# Patient Record
Sex: Male | Born: 1990 | Race: White | Hispanic: No | Marital: Married | Smoking: Never smoker
Health system: Southern US, Community
[De-identification: ages and names within clinical notes are randomized; demographics above are authoritative.]

## PROBLEM LIST (undated history)

## (undated) DIAGNOSIS — Z8619 Personal history of other infectious and parasitic diseases: Secondary | ICD-10-CM

## (undated) DIAGNOSIS — T7840XA Allergy, unspecified, initial encounter: Secondary | ICD-10-CM

## (undated) HISTORY — DX: Allergy, unspecified, initial encounter: T78.40XA

## (undated) HISTORY — DX: Personal history of other infectious and parasitic diseases: Z86.19

## (undated) HISTORY — PX: TYMPANOSTOMY TUBE PLACEMENT: SHX32

## (undated) HISTORY — PX: VASECTOMY: SHX75

---
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
## 2012-11-17 ENCOUNTER — Ambulatory Visit: Payer: Managed Care, Other (non HMO) | Admitting: Family

## 2012-11-17 DIAGNOSIS — Z0289 Encounter for other administrative examinations: Secondary | ICD-10-CM

## 2016-12-20 NOTE — Progress Notes (Signed)
This encounter was created in error - please disregard.

## 2018-07-01 DIAGNOSIS — M5116 Intervertebral disc disorders with radiculopathy, lumbar region: Secondary | ICD-10-CM | POA: Insufficient documentation

## 2018-12-04 DIAGNOSIS — F329 Major depressive disorder, single episode, unspecified: Secondary | ICD-10-CM | POA: Insufficient documentation

## 2019-07-01 ENCOUNTER — Other Ambulatory Visit: Payer: Self-pay

## 2019-07-01 ENCOUNTER — Ambulatory Visit (HOSPITAL_COMMUNITY)
Admission: EM | Admit: 2019-07-01 | Discharge: 2019-07-01 | Disposition: A | Discharge status: Home / Self Care | Attending: Family Medicine | Admitting: Family Medicine

## 2019-07-01 ENCOUNTER — Encounter (HOSPITAL_COMMUNITY): Payer: Self-pay

## 2019-07-01 DIAGNOSIS — K529 Noninfective gastroenteritis and colitis, unspecified: Secondary | ICD-10-CM

## 2019-07-01 DIAGNOSIS — M62830 Muscle spasm of back: Secondary | ICD-10-CM

## 2019-07-01 MED ORDER — CYCLOBENZAPRINE HCL 10 MG PO TABS: ORAL_TABLET | ORAL | 0 refills | Status: DC

## 2019-07-01 MED ORDER — ONDANSETRON 4 MG PO TBDP: 4.0000 mg | ORAL_TABLET | Freq: Three times a day (TID) | ORAL | 0 refills | Status: DC | PRN

## 2019-07-01 NOTE — ED Notes (Signed)
Bed: UC10 Expected date:  Expected time:  Means of arrival:  Comments: 1:00 Appt

## 2019-07-01 NOTE — ED Triage Notes (Addendum)
Pt states his N/V/D started Sunday he started off burping & then the actual Vomiting started Tues. States before he vomits he burps and smells like eggs & he has had 15 BM's in one day. States he does NOT want COVID testing.

## 2019-07-01 NOTE — Discharge Instructions (Addendum)

## 2019-07-01 NOTE — ED Provider Notes (Signed)
[name]   [id number] 07/01/19 Arrival Time: 3491  ASSESSMENT & PLAN:  1. Gastroenteritis   2. Spasm of muscle of lower back     Declines COVID testing.   Meds ordered this encounter  Medications  . cyclobenzaprine (FLEXERIL) 10 MG tablet    Sig: Take 1 tablet by mouth 3 times daily as needed for muscle spasm. Warning: May cause drowsiness.    Dispense:  21 tablet    Refill:  0  . ondansetron (ZOFRAN-ODT) 4 MG disintegrating tablet    Sig: Take 1 tablet (4 mg total) by mouth every 8 (eight) hours as needed for nausea or vomiting.    Dispense:  15 tablet    Refill:  0    Discussed typical duration of symptoms for suspected viral GI illness. Will do his best to ensure adequate fluid intake in order to avoid dehydration. Will proceed to the Emergency Department for evaluation if unable to tolerate PO fluids regularly.  Otherwise he will f/u with his PCP or here if not showing improvement over the next 48-72 hours.  Follow-up Information    [facility].   Why: If back pain fails to improve as anticipated. Contact information: [address] [place] [phone]          Reviewed expectations re: course of current medical issues. Questions answered. Outlined signs and symptoms indicating need for more acute intervention. Patient verbalized understanding. After Visit Summary given.   SUBJECTIVE: History from: patient.  Jeffery Martin is a 29 y.o. male who presents with complaint of non-bilious, non-bloody intermittent n/v with non-bloody diarrhea. H/O frequent loose stools reported. Onset several days ago. Abdominal discomfort: mild and cramping if present. Symptoms are stable since beginning. Aggravating factors: eating. Alleviating factors: none. Associated symptoms: mild fatigue. He denies arthralgias, belching, chills, constipation and myalgias. Appetite: normal. PO intake: normal.  Ambulatory without assistance. Urinary symptoms: none. Sick contacts: none. Recent travel or camping: none. OTC treatment: none.  Also reports left mid to low back pain. H/O similar in [employer] Question spasm. Recent flare. Not limiting daily activities. No extremity sensation changes or weakness.   Past Surgical History:  Procedure Laterality Date  . TYMPANOSTOMY TUBE PLACEMENT  1996/97   bilateral     OBJECTIVE:  Vitals:   07/01/19 1328 07/01/19 1330  BP:  129/76  Pulse:  83  Resp:  18  Temp:  98.1 F (36.7 C)  TempSrc:  Oral  SpO2:  100%  Weight: 124.7 kg     General appearance: alert; no distress Lungs: speaks full sentences without difficulty; unlabored Heart: regular Abdomen: soft; no tenderness reported Back: mild lumbar paraspinal musculature TTP reported Extremities: no edema Skin: warm; dry Neurologic: normal gait Psychological: alert and cooperative; normal mood and affect    No Known Allergies                                             Past Medical History:  Diagnosis Date  . Allergy   . History of chicken pox    Social History   Socioeconomic History  . Marital status: Married    Spouse name: Not on file  . Number of children: Not on file  . Years of education: Not on file  . Highest education level: Not on file  Occupational History  .  Not on file  Tobacco Use  . Smoking status: Never Smoker  . Smokeless tobacco: Never Used  Substance and Sexual Activity  . Alcohol use: No  . Drug use: No  . Sexual activity: Yes    Birth control/protection: None  Other Topics Concern  . Not on file  Social History Narrative   [employer]- senior, plans to start working next year.   Lives with mom, step Dad- Kathrin Ruddy and bird.           Social Determinants of Health   Financial Resource Strain:   . Difficulty of Paying Living Expenses: Not on file  Food Insecurity:   . Worried About Programme researcher, broadcasting/film/video in the Last Year: Not on file    . Ran Out of Food in the Last Year: Not on file  Transportation Needs:   . Lack of Transportation (Medical): Not on file  . Lack of Transportation (Non-Medical): Not on file  Physical Activity:   . Days of Exercise per Week: Not on file  . Minutes of Exercise per Session: Not on file  Stress:   . Feeling of Stress : Not on file  Social Connections:   . Frequency of Communication with Friends and Family: Not on file  . Frequency of Social Gatherings with Friends and Family: Not on file  . Attends Religious Services: Not on file  . Active Member of Clubs or Organizations: Not on file  . Attends Banker Meetings: Not on file  . Marital Status: Not on file  Intimate Partner Violence:   . Fear of Current or Ex-Partner: Not on file  . Emotionally Abused: Not on file  . Physically Abused: Not on file  . Sexually Abused: Not on file   Family History  Problem Relation Age of Onset  . Hypertension Father   . Heart failure Father   . Stroke Other   . Cancer Paternal Grandmother        bone cancer  . Heart disease Neg Hx      Jeffery Layman, MD 07/01/19 [phone]

## 2020-06-27 ENCOUNTER — Other Ambulatory Visit: Payer: Self-pay

## 2020-06-27 ENCOUNTER — Emergency Department
Admission: EM | Admit: 2020-06-27 | Discharge: 2020-06-28 | Disposition: A | Discharge status: Home / Self Care | Attending: Emergency Medicine | Admitting: Emergency Medicine

## 2020-06-27 DIAGNOSIS — Z5321 Procedure and treatment not carried out due to patient leaving prior to being seen by health care provider: Secondary | ICD-10-CM | POA: Diagnosis not present

## 2020-06-27 DIAGNOSIS — H9201 Otalgia, right ear: Secondary | ICD-10-CM | POA: Insufficient documentation

## 2020-06-27 NOTE — ED Triage Notes (Signed)
Pt in with earache that started today, has had cold symptoms since Friday.

## 2020-06-30 ENCOUNTER — Ambulatory Visit: Payer: Self-pay

## 2020-06-30 ENCOUNTER — Ambulatory Visit
Admission: EM | Admit: 2020-06-30 | Discharge: 2020-06-30 | Disposition: A | Discharge status: Home / Self Care | Attending: Emergency Medicine | Admitting: Emergency Medicine

## 2020-06-30 ENCOUNTER — Other Ambulatory Visit: Payer: Self-pay

## 2020-06-30 DIAGNOSIS — H6691 Otitis media, unspecified, right ear: Secondary | ICD-10-CM

## 2020-06-30 DIAGNOSIS — Z1152 Encounter for screening for COVID-19: Secondary | ICD-10-CM | POA: Diagnosis not present

## 2020-06-30 MED ORDER — AMOXICILLIN-POT CLAVULANATE 875-125 MG PO TABS: 1.0000 | ORAL_TABLET | Freq: Two times a day (BID) | ORAL | 0 refills | Status: AC

## 2020-06-30 NOTE — ED Provider Notes (Signed)
[name]    CSN: [id number] Arrival date & time: 06/30/20  1209      History   Chief Complaint Chief Complaint  Patient presents with   Otalgia    Right ear x 1 4 days   Cough    X 1 week    HPI Jeffery Martin is a 30 y.o. male.   The history is provided by the patient. No language interpreter was used.  Otalgia Location:  Right Quality:  Aching Severity:  Severe Onset quality:  Gradual Duration:  1 week Timing:  Constant Progression:  Worsening Chronicity:  New Relieved by:  Nothing Worsened by:  Nothing Associated symptoms: cough   Cough Associated symptoms: ear pain   Pt reports he has been exposed to covid.  Pt reports he began having a cough several days ago   Past Medical History:  Diagnosis Date   Allergy    History of chicken pox     Patient Active Problem List   Diagnosis Date Noted   Sinusitis 07/08/2011   Conjunctivitis of left eye 07/08/2011   PROBLEMS WITH HEARING 03/19/2010    Past Surgical History:  Procedure Laterality Date   TYMPANOSTOMY TUBE PLACEMENT  1996/97   bilateral       Home Medications    Prior to Admission medications   Medication Sig Start Date End Date Taking? Authorizing Provider  amoxicillin-clavulanate (AUGMENTIN) 875-125 MG tablet Take 1 tablet by mouth every 12 (twelve) hours for 10 days. 06/30/20 07/10/20 Yes Cheron Schaumann K, PA-C  cyclobenzaprine (FLEXERIL) 10 MG tablet Take 1 tablet by mouth 3 times daily as needed for muscle spasm. Warning: May cause drowsiness. 07/01/19   Mardella Layman, MD  ondansetron (ZOFRAN-ODT) 4 MG disintegrating tablet Take 1 tablet (4 mg total) by mouth every 8 (eight) hours as needed for nausea or vomiting. 07/01/19   Mardella Layman, MD    Family History Family History  Problem Relation Age of Onset   Hypertension Father    Heart failure Father    Stroke Other    Cancer Paternal Grandmother        bone cancer   Heart disease Neg Hx     Social  History Social History   Tobacco Use   Smoking status: Never Smoker   Smokeless tobacco: Never Used  Building services engineer Use: Never used  Substance Use Topics   Alcohol use: No   Drug use: No     Allergies   Patient has no known allergies.   Review of Systems Review of Systems  HENT: Positive for ear pain.   Respiratory: Positive for cough.   All other systems reviewed and are negative.    Physical Exam Triage Vital Signs ED Triage Vitals  Enc Vitals Group     BP 06/30/20 1244 126/81     Pulse Rate 06/30/20 1244 84     Resp 06/30/20 1244 18     Temp 06/30/20 1244 98.3 F (36.8 C)     Temp Source 06/30/20 1244 Oral     SpO2 06/30/20 1244 97 %     Weight --      Height --      Head Circumference --      Peak Flow --      Pain Score 06/30/20 1239 4     Pain Loc --      Pain Edu? --      Excl. in GC? --    No data found.  Updated Vital Signs BP 126/81 (BP Location: Left Arm)    Pulse 84    Temp 98.3 F (36.8 C) (Oral)    Resp 18    SpO2 97%   Visual Acuity Right Eye Distance:   Left Eye Distance:   Bilateral Distance:    Right Eye Near:   Left Eye Near:    Bilateral Near:     Physical Exam Vitals and nursing note reviewed.  Constitutional:      Appearance: He is well-developed and well-nourished.  HENT:     Head: Normocephalic and atraumatic.     Ears:     Comments: Right tm erythematous, bulging,  Landmarks obscured.     Mouth/Throat:     Mouth: Mucous membranes are moist.  Eyes:     Conjunctiva/sclera: Conjunctivae normal.  Cardiovascular:     Rate and Rhythm: Normal rate and regular rhythm.     Heart sounds: No murmur heard.   Pulmonary:     Effort: Pulmonary effort is normal. No respiratory distress.     Breath sounds: Normal breath sounds.  Abdominal:     Palpations: Abdomen is soft.     Tenderness: There is no abdominal tenderness.  Musculoskeletal:        General: No edema. Normal range of motion.     Cervical back: Neck  supple.  Skin:    General: Skin is warm and dry.  Neurological:     Mental Status: He is alert.  Psychiatric:        Mood and Affect: Mood and affect normal.      UC Treatments / Results  Labs (all labs ordered are listed, but only abnormal results are displayed) Labs Reviewed  NOVEL CORONAVIRUS, NAA    EKG   Radiology No results found.  Procedures Procedures (including critical care time)  Medications Ordered in UC Medications - No data to display  Initial Impression / Assessment and Plan / UC Course  I have reviewed the triage vital signs and the nursing notes.  Pertinent labs & imaging results that were available during my care of the patient were reviewed by me and considered in my medical decision making (see chart for details).     MDM:  Covid pending,  Pt has otitis media, Pt given rx for augmentin.  Final Clinical Impressions(s) / UC Diagnoses   Final diagnoses:  Encounter for screening for COVID-19  Right otitis media, unspecified otitis media type     Discharge Instructions     Your covid is pending    ED Prescriptions    Medication Sig Dispense Auth. Provider   amoxicillin-clavulanate (AUGMENTIN) 875-125 MG tablet Take 1 tablet by mouth every 12 (twelve) hours for 10 days. 20 tablet Elson Areas, [place]     PDMP not reviewed this encounter.  An After Visit Summary was printed and given to the patient.    Elson Areas, [place] 06/30/20 1314

## 2020-06-30 NOTE — Discharge Instructions (Signed)
Your covid is pending °

## 2020-06-30 NOTE — ED Triage Notes (Signed)
Patient states he has had congestion, nasal and chest, x 1 week. Pt also states he has had headaches body aches as well. Pt states he has also had a cough. Pt is aox4 and ambulatory.

## 2020-07-02 LAB — NOVEL CORONAVIRUS, NAA: SARS-CoV-2, NAA: DETECTED — AB

## 2020-07-02 LAB — SARS-COV-2, NAA 2 DAY TAT

## 2020-07-03 ENCOUNTER — Telehealth: Payer: Self-pay | Admitting: *Deleted

## 2020-07-03 NOTE — Telephone Encounter (Signed)
Called to discuss with patient about COVID-19 symptoms and the use of one of the available treatments for those with mild to moderate Covid symptoms and at a high risk of hospitalization.  Pt appears to qualify for outpatient treatment due to co-morbid conditions and/or a member of an at-risk group in accordance with the FDA Emergency Use Authorization.    Symptom onset:  Vaccinated:  Booster?  Immunocompromised?  Qualifiers:   Unable to reach pt - No answer and mailbox is full. Unable to leave VM.  Karsten Fells

## 2021-01-27 ENCOUNTER — Ambulatory Visit
Admission: RE | Admit: 2021-01-27 | Discharge: 2021-01-27 | Disposition: A | Discharge status: Home / Self Care | Source: Ambulatory Visit | Attending: Emergency Medicine | Admitting: Emergency Medicine

## 2021-01-27 ENCOUNTER — Other Ambulatory Visit: Payer: Self-pay

## 2021-01-27 ENCOUNTER — Ambulatory Visit (INDEPENDENT_AMBULATORY_CARE_PROVIDER_SITE_OTHER)

## 2021-01-27 VITALS — BP 127/83 | HR 94 | Temp 97.9°F | Resp 16

## 2021-01-27 DIAGNOSIS — L03115 Cellulitis of right lower limb: Secondary | ICD-10-CM | POA: Diagnosis not present

## 2021-01-27 DIAGNOSIS — M25571 Pain in right ankle and joints of right foot: Secondary | ICD-10-CM | POA: Diagnosis not present

## 2021-01-27 DIAGNOSIS — S99911A Unspecified injury of right ankle, initial encounter: Secondary | ICD-10-CM

## 2021-01-27 MED ORDER — DOXYCYCLINE HYCLATE 100 MG PO CAPS: 100.0000 mg | ORAL_CAPSULE | Freq: Two times a day (BID) | ORAL | 0 refills | Status: AC
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
## 2021-01-27 NOTE — Discharge Instructions (Addendum)
Take doxycycline as prescribed.  Continue applying topical antibacterial agents (Neosporin or bacitracin) twice daily to the affected areas.  After you have applied topical agents, place a Telfa/nonstick dressing over the area and use paper tape to adhere to skin.  Keep right ankle elevated, iced and consider applying an Ace wrap to the area to help with swelling.  If you begin to notice any fever, chills, vomiting or worsening pain to the areas please return to clinic for reevaluation.

## 2021-12-02 ENCOUNTER — Ambulatory Visit: Payer: Self-pay

## 2022-01-14 ENCOUNTER — Ambulatory Visit
Admission: EM | Admit: 2022-01-14 | Discharge: 2022-01-14 | Disposition: A | Discharge status: Home / Self Care | Attending: Family Medicine | Admitting: Family Medicine

## 2022-01-14 ENCOUNTER — Ambulatory Visit: Payer: Self-pay

## 2022-01-14 ENCOUNTER — Ambulatory Visit (INDEPENDENT_AMBULATORY_CARE_PROVIDER_SITE_OTHER)

## 2022-01-14 DIAGNOSIS — N50812 Left testicular pain: Secondary | ICD-10-CM

## 2022-01-14 DIAGNOSIS — N451 Epididymitis: Secondary | ICD-10-CM | POA: Diagnosis not present

## 2022-01-14 MED ORDER — DOXYCYCLINE HYCLATE 100 MG PO CAPS: 100.0000 mg | ORAL_CAPSULE | Freq: Two times a day (BID) | ORAL | 0 refills | Status: AC

## 2022-01-14 MED ORDER — CEFTRIAXONE SODIUM 500 MG IJ SOLR
500.0000 mg | Freq: Once | INTRAMUSCULAR | Status: AC
  Administered 2022-01-14: 500 mg via INTRAMUSCULAR

## 2022-01-14 NOTE — ED Provider Notes (Signed)
[facility]    CSN: [id number] Arrival date & time: 01/14/22  1109      History   Chief Complaint Chief Complaint  Patient presents with   Groin Swelling    Jeffery CAREEM Jeffery Martin is a 31 y.o. male.   Jeffery Martin 31 year old male presents with left testicle pain and swelling for 2 days.  Reports history of vasectomy 4 years ago.  Denies injury or trauma to left testicle.  Past Medical History:  Diagnosis Date   Allergy    History of chicken pox     Patient Active Problem List   Diagnosis Date Noted   Sinusitis 07/08/2011   Conjunctivitis of left eye 07/08/2011   PROBLEMS WITH HEARING 03/19/2010    Past Surgical History:  Procedure Laterality Date   TYMPANOSTOMY TUBE PLACEMENT  1996/97   bilateral   VASECTOMY         Home Medications    Prior to Admission medications   Medication Sig Start Date End Date Taking? Authorizing Provider  doxycycline (VIBRAMYCIN) 100 MG capsule Take 1 capsule (100 mg total) by mouth 2 (two) times daily for 10 days. 01/14/22 01/24/22 Yes Trevor Iha, FNP    Family History Family History  Problem Relation Age of Onset   Hypertension Father    Heart failure Father    Stroke Other    Cancer Paternal Grandmother        bone cancer   Heart disease Neg Hx     Social History Social History   Tobacco Use   Smoking status: Never   Smokeless tobacco: Never  Vaping Use   Vaping Use: Never used  Substance Use Topics   Alcohol use: No   Drug use: No     Allergies   Patient has no known allergies.   Review of Systems Review of Systems  Genitourinary:  Positive for testicular pain.     Physical Exam Triage Vital Signs ED Triage Vitals  Enc Vitals Group     BP 01/14/22 1124 (!) 137/92     Pulse Rate 01/14/22 1124 70     Resp 01/14/22 1124 14     Temp 01/14/22 1124 98.3 F (36.8 C)     Temp Source 01/14/22 1124 Oral     SpO2 01/14/22 1124 98 %     Weight --      Height --      Head Circumference --       Peak Flow --      Pain Score 01/14/22 1122 0     Pain Loc --      Pain Edu? --      Excl. in [place]? --    No data found.  Updated Vital Signs BP (!) 137/92 (BP Location: Right Arm)   Pulse 70   Temp 98.3 F (36.8 C) (Oral)   Resp 14   SpO2 98%     Physical Exam Vitals and nursing note reviewed.  Constitutional:      Appearance: Normal appearance. He is obese. He is ill-appearing.  HENT:     Head: Normocephalic and atraumatic.     Mouth/Throat:     Mouth: Mucous membranes are moist.     Pharynx: Oropharynx is clear.  Eyes:     Extraocular Movements: Extraocular movements intact.     Conjunctiva/sclera: Conjunctivae normal.     Pupils: Pupils are equal, round, and reactive to light.  Cardiovascular:     Rate and Rhythm: Normal rate and regular  rhythm.     Pulses: Normal pulses.     Heart sounds: Normal heart sounds. No murmur heard. Pulmonary:     Effort: Pulmonary effort is normal.     Breath sounds: Normal breath sounds. No wheezing, rhonchi or rales.  Abdominal:     Tenderness: There is no right CVA tenderness or left CVA tenderness.  Genitourinary:    Comments: Left testicle/scrotum-Severe TTP with moderate/significant swelling noted, palpable epididymis Musculoskeletal:        General: Normal range of motion.     Cervical back: Normal range of motion and neck supple.  Skin:    General: Skin is warm and dry.  Neurological:     General: No focal deficit present.     Mental Status: He is alert and oriented to person, place, and time. Mental status is at baseline.      UC Treatments / Results  Labs (all labs ordered are listed, but only abnormal results are displayed) Labs Reviewed - No data to display  EKG   Radiology US SCROTUM W/DOPPLER  Result Date: 01/14/2022 CLINICAL DATA:  Left-sided testicular pain and swelling. EXAM: SCROTAL ULTRASOUND DOPPLER ULTRASOUND OF THE TESTICLES TECHNIQUE: Complete ultrasound examination of the testicles, epididymis, and  other scrotal structures was performed. Color and spectral Doppler ultrasound were also utilized to evaluate blood flow to the testicles. COMPARISON:  None Available. FINDINGS: Right testicle Measurements: 3.7 x 1.6 x 2.4 cm. Scattered calcifications likely represent mild testicular microlithiasis. No mass identified. Left testicle Measurements: 5.0 x 2.7 x 3.1 cm. Mildly heterogeneous echotexture. increased color Doppler signal indicative of hyperemia. Right epididymis:  1.0 cm epididymal cyst versus spermatocele. Left epididymis:  Enlarged and hypervascular, including on image 53. Hydrocele:  None visualized. Varicocele:  None visualized. Pulsed Doppler interrogation of both testes demonstrates normal low resistance arterial and venous waveforms bilaterally. IMPRESSION: 1. Left-sided epididymitis/orchitis. 2. Mild right-sided testicular microlithiasis. Current literature suggests that testicular microlithiasis is not a significant independent risk factor for development of testicular carcinoma, and that follow up imaging is not warranted in the absence of other risk factors. Monthly testicular self-examination and annual physical exams are considered appropriate surveillance. If patient has other risk factors for testicular carcinoma, then referral to Urology should be considered. (Reference: DeCastro, et al.: A 5-Year Follow up Study of Asymptomatic Men with Testicular Microlithiasis. J Urol 2008; 179:1420-1423.) 3. Right epididymal cyst/spermatocele. Electronically Signed   By: Jeronimo Greaves M.D.   On: 01/14/2022 12:34    Procedures Procedures (including critical care time)  Medications Ordered in UC Medications  cefTRIAXone (ROCEPHIN) injection 500 mg (500 mg Intramuscular Given 01/14/22 1252)    Initial Impression / Assessment and Plan / UC Course  I have reviewed the triage vital signs and the nursing notes.  Pertinent labs & imaging results that were available during my care of the patient were  reviewed by me and considered in my medical decision making (see chart for details).     MDM: 1.  Left testicular pain-ultrasound of scrotum revealed above; 2.  Left epididymitis-IM ceftriaxone 500 mg given once in clinic, Rx'd doxycycline. Advised/informed patient of results of ultrasound of scrotum today with hard copy provided to patient.  Advised patient to take medication as directed with food to completion.  Encouraged patient increase daily water intake while taking this medication.  Advised patient if symptoms worsen and/or unknown resolved please follow-up with PCP or here for further evaluation.  Work note provided to patient prior to discharge.  Patient discharged  home hemodynamically stable. Final Clinical Impressions(s) / UC Diagnoses   Final diagnoses:  Left testicular pain  Left epididymitis     Discharge Instructions      Advised/informed patient of results of ultrasound of scrotum today with hard copy provided to patient.  Advised patient to take medication as directed with food to completion.  Encouraged patient increase daily water intake while taking this medication.  Advised patient if symptoms worsen and/or unknown resolved please follow-up with PCP or here for further evaluation.      ED Prescriptions     Medication Sig Dispense Auth. Provider   doxycycline (VIBRAMYCIN) 100 MG capsule Take 1 capsule (100 mg total) by mouth 2 (two) times daily for 10 days. 20 capsule Trevor Iha, FNP      PDMP not reviewed this encounter.   Trevor Iha, FNP 01/14/22 1259

## 2022-01-14 NOTE — ED Triage Notes (Signed)
Pt presents with left testicle pain and swelling x 2 days. Pt has hx of vasectomy 4 years ago.

## 2022-01-14 NOTE — Discharge Instructions (Addendum)
Advised/informed patient of results of ultrasound of scrotum today with hard copy provided to patient.  Advised patient to take medication as directed with food to completion.  Encouraged patient increase daily water intake while taking this medication.  Advised patient if symptoms worsen and/or unknown resolved please follow-up with PCP or here for further evaluation.

## 2022-03-19 ENCOUNTER — Ambulatory Visit
Admission: RE | Admit: 2022-03-19 | Discharge: 2022-03-19 | Disposition: A | Discharge status: Home / Self Care | Source: Ambulatory Visit

## 2022-03-19 VITALS — BP 131/88 | HR 63 | Temp 98.3°F | Resp 18

## 2022-03-19 DIAGNOSIS — B9689 Other specified bacterial agents as the cause of diseases classified elsewhere: Secondary | ICD-10-CM | POA: Diagnosis not present

## 2022-03-19 DIAGNOSIS — J209 Acute bronchitis, unspecified: Secondary | ICD-10-CM | POA: Diagnosis not present

## 2022-03-19 DIAGNOSIS — G4733 Obstructive sleep apnea (adult) (pediatric): Secondary | ICD-10-CM | POA: Insufficient documentation

## 2022-03-19 DIAGNOSIS — R030 Elevated blood-pressure reading, without diagnosis of hypertension: Secondary | ICD-10-CM | POA: Insufficient documentation

## 2022-03-19 DIAGNOSIS — J069 Acute upper respiratory infection, unspecified: Secondary | ICD-10-CM | POA: Diagnosis not present

## 2022-03-19 MED ORDER — AZITHROMYCIN 250 MG PO TABS: ORAL_TABLET | ORAL | 0 refills | Status: DC

## 2022-03-19 NOTE — ED Triage Notes (Addendum)
Pt. States he has been having a cough and congestion. Pt. Has been coughing up yellow/green phlegm for the past few days. Pt. Also endorses some chest tightness from coughing. Pt. Declined respiratory swab.

## 2022-03-19 NOTE — Discharge Instructions (Addendum)
Follow up here or with your primary care provider if your symptoms are worsening or not improving with treatment.     

## 2022-03-19 NOTE — ED Provider Notes (Signed)
[name]    CSN: [id number] Arrival date & time: 03/19/22  1741      History   Chief Complaint Chief Complaint  Patient presents with   Cough    Severe cough, congestion and chest pain - Entered by patient   Nasal Congestion    HPI Jeffery Martin is a 31 y.o. male.    Cough   Presents to urgent care with symptoms greater than 1 week.  Endorses severe cough, congestion and chest pain.  With symptoms worsening in the past few days.  His cough is productive of yellow/green phlegm.  He states his wife and child were diagnosed with "bronchitis" and were prescribed an antibiotic and symptoms resolved within a couple of days.  He would like a prescription for similar.  He also states he would like to avoid medications that might "make him mean.  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  Past Medical History:  Diagnosis Date   Allergy    History of chicken pox     Patient Active Problem List   Diagnosis Date Noted   Elevated blood-pressure reading without diagnosis of hypertension 03/19/2022   Obstructive sleep apnea of adult 03/19/2022   Major depressive disorder 12/04/2018   Lumbar disc disease with radiculopathy 07/01/2018   Sinusitis 07/08/2011   Conjunctivitis of left eye 07/08/2011   PROBLEMS WITH HEARING 03/19/2010    Past Surgical History:  Procedure Laterality Date   TYMPANOSTOMY TUBE PLACEMENT  1996/97   bilateral   VASECTOMY         Home Medications    Prior to Admission medications   Medication Sig Start Date End Date Taking? Authorizing Provider  venlafaxine XR (EFFEXOR-XR) 37.5 MG 24 hr capsule Take by mouth. 02/23/19  Yes [provider]    Family History Family History  Problem Relation Age of Onset   Hypertension Father    Heart failure Father    Stroke Other    Cancer Paternal Grandmother        bone cancer   Heart disease Neg Hx     Social History Social  History   Tobacco Use   Smoking status: Never   Smokeless tobacco: Never  Vaping Use   Vaping Use: Never used  Substance Use Topics   Alcohol use: No   Drug use: No     Allergies   Patient has no known allergies.   Review of Systems Review of Systems  Respiratory:  Positive for cough.      Physical Exam Triage Vital Signs ED Triage Vitals  Enc Vitals Group     BP 03/19/22 1807 131/88     Pulse Rate 03/19/22 1807 63     Resp 03/19/22 1807 18     Temp 03/19/22 1807 98.3 F (36.8 C)     Temp Source 03/19/22 1807 Oral     SpO2 03/19/22 1807 97 %     Weight --      Height --      Head Circumference --      Peak Flow --      Pain Score 03/19/22 1804 2     Pain Loc --      Pain Edu? --      Excl. in [place]? --    No data found.  Updated Vital Signs BP 131/88 (BP Location: Left Arm)   Pulse 63   Temp 98.3 F (36.8 C) (Oral)  Resp 18   SpO2 97%   Visual Acuity Right Eye Distance:   Left Eye Distance:   Bilateral Distance:    Right Eye Near:   Left Eye Near:    Bilateral Near:     Physical Exam Vitals reviewed.  Constitutional:      Appearance: Normal appearance.  HENT:     Mouth/Throat:     Mouth: Mucous membranes are moist.     Pharynx: No oropharyngeal exudate or posterior oropharyngeal erythema.  Eyes:     Conjunctiva/sclera: Conjunctivae normal.     Pupils: Pupils are equal, round, and reactive to light.  Cardiovascular:     Rate and Rhythm: Normal rate and regular rhythm.  Pulmonary:     Effort: Pulmonary effort is normal.     Breath sounds: Transmitted upper airway sounds present.  Skin:    General: Skin is warm and dry.  Neurological:     General: No focal deficit present.     Mental Status: He is alert and oriented to person, place, and time.  Psychiatric:        Mood and Affect: Mood normal.        Behavior: Behavior normal.      UC Treatments / Results  Labs (all labs ordered are listed, but only abnormal results are  displayed) Labs Reviewed - No data to display  EKG   Radiology No results found.  Procedures Procedures (including critical care time)  Medications Ordered in UC Medications - No data to display  Initial Impression / Assessment and Plan / UC Course  I have reviewed the triage vital signs and the nursing notes.  Pertinent labs & imaging results that were available during my care of the patient were reviewed by me and considered in my medical decision making (see chart for details).   Patient with very dry cough.  Lungs with transmitted upper respiratory sounds.  Severe, frequent cough is present.  Will prescribe azithromycin for his likely bacterial URI symptoms causing cough.   Final Clinical Impressions(s) / UC Diagnoses   Final diagnoses:  None   Discharge Instructions   None    ED Prescriptions   None    PDMP not reviewed this encounter.   Charma Igo, [place] 03/19/22 [phone]

## 2022-03-31 ENCOUNTER — Emergency Department

## 2022-03-31 ENCOUNTER — Other Ambulatory Visit: Payer: Self-pay

## 2022-03-31 ENCOUNTER — Encounter: Payer: Self-pay | Admitting: Emergency Medicine

## 2022-03-31 ENCOUNTER — Emergency Department
Admission: EM | Admit: 2022-03-31 | Discharge: 2022-03-31 | Disposition: A | Discharge status: Home / Self Care | Attending: Emergency Medicine | Admitting: Emergency Medicine

## 2022-03-31 DIAGNOSIS — S82301B Unspecified fracture of lower end of right tibia, initial encounter for open fracture type I or II: Secondary | ICD-10-CM | POA: Insufficient documentation

## 2022-03-31 DIAGNOSIS — S91011A Laceration without foreign body, right ankle, initial encounter: Secondary | ICD-10-CM | POA: Insufficient documentation

## 2022-03-31 DIAGNOSIS — T148XXA Other injury of unspecified body region, initial encounter: Secondary | ICD-10-CM

## 2022-03-31 DIAGNOSIS — S99911A Unspecified injury of right ankle, initial encounter: Secondary | ICD-10-CM | POA: Diagnosis present

## 2022-03-31 MED ORDER — LIDOCAINE HCL 1 % IJ SOLN
5.0000 mL | Freq: Once | INTRAMUSCULAR | Status: AC
  Administered 2022-03-31: 5 mL
  Filled 2022-03-31: qty 10

## 2022-03-31 MED ORDER — CEPHALEXIN 500 MG PO CAPS
500.0000 mg | ORAL_CAPSULE | Freq: Four times a day (QID) | ORAL | 0 refills | Status: AC
  Filled 2022-03-31: qty 28, 7d supply, fill #0
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
## 2022-03-31 NOTE — Discharge Instructions (Signed)
Take Keflex 4 times daily for the next 7 days. Have sutures removed in 10 days. Please make a follow-up appointment with podiatry. Please wear cam boot until cleared by podiatry. You can apply a thin layer of Vaseline daily.

## 2022-04-01 ENCOUNTER — Other Ambulatory Visit (HOSPITAL_BASED_OUTPATIENT_CLINIC_OR_DEPARTMENT_OTHER): Payer: Self-pay

## 2022-04-03 ENCOUNTER — Telehealth: Admitting: Family Medicine

## 2022-04-03 ENCOUNTER — Encounter: Payer: Self-pay | Admitting: Emergency Medicine

## 2022-04-03 ENCOUNTER — Other Ambulatory Visit: Payer: Self-pay

## 2022-04-03 ENCOUNTER — Emergency Department
Admission: EM | Admit: 2022-04-03 | Discharge: 2022-04-03 | Disposition: A | Discharge status: Home / Self Care | Attending: Emergency Medicine | Admitting: Emergency Medicine

## 2022-04-03 DIAGNOSIS — R200 Anesthesia of skin: Secondary | ICD-10-CM | POA: Insufficient documentation

## 2022-04-03 DIAGNOSIS — M7989 Other specified soft tissue disorders: Secondary | ICD-10-CM | POA: Diagnosis not present

## 2022-04-03 DIAGNOSIS — R609 Edema, unspecified: Secondary | ICD-10-CM

## 2022-04-03 DIAGNOSIS — S99911A Unspecified injury of right ankle, initial encounter: Secondary | ICD-10-CM | POA: Diagnosis present

## 2022-04-03 DIAGNOSIS — Y92410 Unspecified street and highway as the place of occurrence of the external cause: Secondary | ICD-10-CM | POA: Diagnosis not present

## 2022-04-03 MED ORDER — IBUPROFEN 800 MG PO TABS: 800.0000 mg | ORAL_TABLET | Freq: Three times a day (TID) | ORAL | 0 refills | Status: AC | PRN
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
## 2022-04-03 NOTE — Discharge Instructions (Signed)
Please continue with compression rest ice and elevation of the right lower extremity.  Follow-up with podiatry office on Friday.  Return to the ER for any worsening symptoms or any urgent changes in your health
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
## 2022-05-03 ENCOUNTER — Other Ambulatory Visit (HOSPITAL_BASED_OUTPATIENT_CLINIC_OR_DEPARTMENT_OTHER): Payer: Self-pay

## 2022-06-27 ENCOUNTER — Ambulatory Visit
Admission: RE | Admit: 2022-06-27 | Discharge: 2022-06-27 | Disposition: A | Discharge status: Home / Self Care | Source: Ambulatory Visit | Attending: Internal Medicine | Admitting: Internal Medicine

## 2022-06-27 VITALS — BP 108/67 | HR 81 | Temp 98.0°F | Resp 12

## 2022-06-27 DIAGNOSIS — J069 Acute upper respiratory infection, unspecified: Secondary | ICD-10-CM

## 2022-06-27 DIAGNOSIS — R053 Chronic cough: Secondary | ICD-10-CM | POA: Diagnosis not present

## 2022-06-27 MED ORDER — PREDNISONE 20 MG PO TABS: 40.0000 mg | ORAL_TABLET | Freq: Every day | ORAL | 0 refills | Status: AC

## 2022-06-27 MED ORDER — PROMETHAZINE-DM 6.25-15 MG/5ML PO SYRP: 5.0000 mL | ORAL_SOLUTION | Freq: Four times a day (QID) | ORAL | 0 refills | Status: AC | PRN

## 2022-06-27 MED ORDER — AZITHROMYCIN 250 MG PO TABS: ORAL_TABLET | ORAL | 0 refills | Status: AC

## 2022-06-27 NOTE — ED Provider Notes (Signed)
[name]    CSN: [id number] Arrival date & time: 06/27/22  1149      History   Chief Complaint Chief Complaint  Patient presents with   Cough    Cough, lots of congestion, ache feeling - Entered by patient    HPI Jeffery Martin is a 32 y.o. male.   Patient presents with over 1 week history of cough, nasal congestion, body aches, chills, sore throat.  Patient reports that he has felt feverish but has not taken temperature with thermometer.  Patient reports cough has become very productive over the past 3 days and symptoms have seemed to worsen.  He also reports that he has significant nasal congestion.  Has taken ibuprofen and guaifenesin with minimal improvement in symptoms.  Patient denies history of asthma or COPD and does not smoke cigarettes.  Denies chest pain, shortness of breath, ear pain, nausea, vomiting, diarrhea, abdominal pain.  Patient does not report any known sick contacts.   Cough   Past Medical History:  Diagnosis Date   Allergy    History of chicken pox     Patient Active Problem List   Diagnosis Date Noted   Elevated blood-pressure reading without diagnosis of hypertension 03/19/2022   Obstructive sleep apnea of adult 03/19/2022   Major depressive disorder 12/04/2018   Lumbar disc disease with radiculopathy 07/01/2018   Sinusitis 07/08/2011   Conjunctivitis of left eye 07/08/2011   PROBLEMS WITH HEARING 03/19/2010    Past Surgical History:  Procedure Laterality Date   TYMPANOSTOMY TUBE PLACEMENT  1996/97   bilateral   VASECTOMY         Home Medications    Prior to Admission medications   Medication Sig Start Date End Date Taking? Authorizing Provider  azithromycin (ZITHROMAX Z-PAK) 250 MG tablet Take 2 tablets (500 mg total) by mouth daily for 1 day, THEN 1 tablet (250 mg total) daily for 4 days. 06/27/22 07/02/22 Yes Shaquia Berkley, Acie Fredrickson, FNP  predniSONE (DELTASONE) 20 MG tablet Take 2 tablets (40 mg total) by mouth daily for 5  days. 06/27/22 07/02/22 Yes [place] Noori, Acie Fredrickson, FNP  promethazine-dextromethorphan (PROMETHAZINE-DM) 6.25-15 MG/5ML syrup Take 5 mLs by mouth every 6 (six) hours as needed for cough. 06/27/22  Yes Gorden Stthomas, Rolly Salter E, FNP  ibuprofen (ADVIL) 800 MG tablet Take 1 tablet (800 mg total) by mouth every 8 (eight) hours as needed. 04/03/22   Evon Slack, PA-C  venlafaxine XR (EFFEXOR-XR) 37.5 MG 24 hr capsule Take by mouth. 02/23/19   [provider]    Family History Family History  Problem Relation Age of Onset   Hypertension Father    Heart failure Father    Stroke Other    Cancer Paternal Grandmother        bone cancer   Heart disease Neg Hx     Social History Social History   Tobacco Use   Smoking status: Never   Smokeless tobacco: Never  Vaping Use   Vaping Use: Never used  Substance Use Topics   Alcohol use: No   Drug use: No     Allergies   Patient has no known allergies.   Review of Systems Review of Systems Per HPI  Physical Exam Triage Vital Signs ED Triage Vitals  Enc Vitals Group     BP 06/27/22 1208 108/67     Pulse Rate 06/27/22 1208 81     Resp 06/27/22 1208 12     Temp 06/27/22 1208 98 F (36.7 C)  Temp Source 06/27/22 1208 Oral     SpO2 06/27/22 1208 97 %     Weight --      Height --      Head Circumference --      Peak Flow --      Pain Score 06/27/22 1206 0     Pain Loc --      Pain Edu? --      Excl. in [place]? --    No data found.  Updated Vital Signs BP 108/67 (BP Location: Left Arm)   Pulse 81   Temp 98 F (36.7 C) (Oral)   Resp 12   SpO2 97%   Visual Acuity Right Eye Distance:   Left Eye Distance:   Bilateral Distance:    Right Eye Near:   Left Eye Near:    Bilateral Near:     Physical Exam Constitutional:      General: He is not in acute distress.    Appearance: Normal appearance. He is not toxic-appearing or diaphoretic.  HENT:     Head: Normocephalic and atraumatic.     Right Ear: Tympanic membrane and ear canal  normal.     Left Ear: Tympanic membrane and ear canal normal.     Nose: Congestion present.     Mouth/Throat:     Mouth: Mucous membranes are moist.     Pharynx: No posterior oropharyngeal erythema.  Eyes:     Extraocular Movements: Extraocular movements intact.     Conjunctiva/sclera: Conjunctivae normal.     Pupils: Pupils are equal, round, and reactive to light.  Cardiovascular:     Rate and Rhythm: Normal rate and regular rhythm.     Pulses: Normal pulses.     Heart sounds: Normal heart sounds.  Pulmonary:     Effort: Pulmonary effort is normal. No respiratory distress.     Breath sounds: Normal breath sounds. No stridor. No wheezing, rhonchi or rales.  Abdominal:     General: Abdomen is flat. Bowel sounds are normal.     Palpations: Abdomen is soft.  Musculoskeletal:        General: Normal range of motion.     Cervical back: Normal range of motion.  Skin:    General: Skin is warm and dry.  Neurological:     General: No focal deficit present.     Mental Status: He is alert and oriented to person, place, and time. Mental status is at baseline.  Psychiatric:        Mood and Affect: Mood normal.        Behavior: Behavior normal.      UC Treatments / Results  Labs (all labs ordered are listed, but only abnormal results are displayed) Labs Reviewed - No data to display  EKG   Radiology No results found.  Procedures Procedures (including critical care time)  Medications Ordered in UC Medications - No data to display  Initial Impression / Assessment and Plan / UC Course  I have reviewed the triage vital signs and the nursing notes.  Pertinent labs & imaging results that were available during my care of the patient were reviewed by me and considered in my medical decision making (see chart for details).     Patient's symptoms most likely started off as a viral upper respiratory infection but given duration of symptoms, there is concern for secondary bacterial  infection.  May also have acute bronchitis.  There are no adventitious lung sounds on exam so do not think  that chest imaging is necessary.  Will treat with antibiotic to cover for bacterial infection/atypicals as well as prednisone to decrease inflammation.  A cough medication was also prescribed for patient.  Advised patient this medication can cause drowsiness.  Do not think viral testing is necessary given duration of symptoms and would not change treatment.  Discussed return precautions.  Patient verbalized understanding and was agreeable with plan. Final Clinical Impressions(s) / UC Diagnoses   Final diagnoses:  Acute upper respiratory infection  Persistent cough     Discharge Instructions      I am treating your upper respiratory infection and persistent cough with antibiotic, cough medication, steroid.  Please be advised that the cough medication can cause drowsiness.  Follow-up if any symptoms persist or worsen.    ED Prescriptions     Medication Sig Dispense Auth. Provider   azithromycin (ZITHROMAX Z-PAK) 250 MG tablet Take 2 tablets (500 mg total) by mouth daily for 1 day, THEN 1 tablet (250 mg total) daily for 4 days. 6 tablet [place], [place] E, [place]   predniSONE (DELTASONE) 20 MG tablet Take 2 tablets (40 mg total) by mouth daily for 5 days. 10 tablet [place], [place] E, [place]   promethazine-dextromethorphan (PROMETHAZINE-DM) 6.25-15 MG/5ML syrup Take 5 mLs by mouth every 6 (six) hours as needed for cough. 118 mL Teodora Medici, [place]      PDMP not reviewed this encounter.   Teodora Medici, [place] 06/27/22 1227

## 2022-06-27 NOTE — Discharge Instructions (Signed)
I am treating your upper respiratory infection and persistent cough with antibiotic, cough medication, steroid.  Please be advised that the cough medication can cause drowsiness.  Follow-up if any symptoms persist or worsen.

## 2022-06-27 NOTE — ED Triage Notes (Signed)
Pt is here for cough, headache  nasal congestion , fever , body aches chills, and sore throat, x 1wk pt took 800mg  of ibuprofen today at 8am today.
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
## 2023-10-04 ENCOUNTER — Ambulatory Visit
Admission: RE | Admit: 2023-10-04 | Discharge: 2023-10-04 | Disposition: A | Discharge status: Home / Self Care | Payer: Self-pay | Source: Ambulatory Visit | Attending: Emergency Medicine | Admitting: Emergency Medicine

## 2023-10-04 VITALS — BP 129/87 | HR 82 | Temp 98.8°F | Ht 73.0 in | Wt 260.0 lb

## 2023-10-04 DIAGNOSIS — J0100 Acute maxillary sinusitis, unspecified: Secondary | ICD-10-CM | POA: Diagnosis not present

## 2023-10-04 MED ORDER — PREDNISONE 10 MG (21) PO TBPK: ORAL_TABLET | Freq: Every day | ORAL | 0 refills | Status: AC

## 2023-10-04 MED ORDER — AMOXICILLIN-POT CLAVULANATE 875-125 MG PO TABS: 1.0000 | ORAL_TABLET | Freq: Two times a day (BID) | ORAL | 0 refills | Status: AC

## 2023-10-04 NOTE — ED Triage Notes (Signed)
 Pt c/o worsening allergies, sneezing, nasal drainage, throat swelling x73month  Pt states that he has similar symptoms every year  Pt has a history of Bronchitis   Pt states that OTC allergy medication does not work.

## 2023-10-04 NOTE — ED Provider Notes (Signed)
 Jeffery Martin    CSN: [id number] Arrival date & time: 10/04/23  1021      History   Chief Complaint Chief Complaint  Patient presents with   Allergic Reaction    Severe allergies, no medication has helped - Entered by patient    HPI Jeffery Martin is a 33 y.o. male.   Patient presents for evaluation of worsening seasonal allergies present over the past month.  Has been experiencing nasal congestion, cough, sneezing, the sensation of throat swelling and intermittent sinus pressure.  Cough has become productive over the past 2 days with clear sputum.  Sinus pressure effects the bilateral cheeks and intermittently extends into the left side of the forehead.  Endorses when sneezing can do so at least 15 times with resolution.  Has been taking Claritin-D, hot steam showers.  Denies fever.  Past Medical History:  Diagnosis Date   Allergy    History of chicken pox     Patient Active Problem List   Diagnosis Date Noted   Elevated blood-pressure reading without diagnosis of hypertension 03/19/2022   Obstructive sleep apnea of adult 03/19/2022   Major depressive disorder 12/04/2018   Lumbar disc disease with radiculopathy 07/01/2018   Sinusitis 07/08/2011   Conjunctivitis of left eye 07/08/2011   PROBLEMS WITH HEARING 03/19/2010    Past Surgical History:  Procedure Laterality Date   TYMPANOSTOMY TUBE PLACEMENT  1996/97   bilateral   VASECTOMY         Home Medications    Prior to Admission medications   Medication Sig Start Date End Date Taking? Authorizing Provider  amoxicillin -clavulanate (AUGMENTIN ) 875-125 MG tablet Take 1 tablet by mouth every 12 (twelve) hours for 10 days. 10/04/23 10/14/23 Yes Jinx Gilden R, NP  predniSONE  (STERAPRED UNI-PAK 21 TAB) 10 MG (21) TBPK tablet Take by mouth daily. Take 6 tabs by mouth daily  for 1 days, then 5 tabs for 1 days, then 4 tabs for 1 days, then 3 tabs for 1 days, 2 tabs for 1 days, then 1 tab by mouth daily for 1 days  10/04/23  Yes Adalena Abdulla R, NP  ibuprofen  (ADVIL ) 800 MG tablet Take 1 tablet (800 mg total) by mouth every 8 (eight) hours as needed. 04/03/22   Coralyn Derry, PA-C  promethazine -dextromethorphan (PROMETHAZINE -DM) 6.25-15 MG/5ML syrup Take 5 mLs by mouth every 6 (six) hours as needed for cough. 06/27/22   Dodson Freestone, FNP  venlafaxine XR (EFFEXOR-XR) 37.5 MG 24 hr capsule Take by mouth. 02/23/19   [provider]    Family History Family History  Problem Relation Age of Onset   Hypertension Father    Heart failure Father    Stroke Other    Cancer Paternal Grandmother        bone cancer   Heart disease Neg Hx     Social History Social History   Tobacco Use   Smoking status: Never   Smokeless tobacco: Never  Vaping Use   Vaping status: Never Used  Substance Use Topics   Alcohol use: No   Drug use: Yes    Types: Marijuana     Allergies   Pollen extract   Review of Systems Review of Systems   Physical Exam Triage Vital Signs ED Triage Vitals  Encounter Vitals Group     BP 10/04/23 1054 129/87     Systolic BP Percentile --      Diastolic BP Percentile --      Pulse Rate 10/04/23  1054 82     Resp --      Temp 10/04/23 1054 98.8 F (37.1 C)     Temp Source 10/04/23 1054 Oral     SpO2 10/04/23 1054 98 %     Weight 10/04/23 1055 260 lb (117.9 kg)     Height 10/04/23 1055 6\' 1"  (1.854 m)     Head Circumference --      Peak Flow --      Pain Score 10/04/23 1052 0     Pain Loc --      Pain Education --      Exclude from Growth Chart --    No data found.  Updated Vital Signs BP 129/87 (BP Location: Left Arm)   Pulse 82   Temp 98.8 F (37.1 C) (Oral)   Ht 6\' 1"  (1.854 m)   Wt 260 lb (117.9 kg)   SpO2 98%   BMI 34.30 kg/m   Visual Acuity Right Eye Distance:   Left Eye Distance:   Bilateral Distance:    Right Eye Near:   Left Eye Near:    Bilateral Near:     Physical Exam Constitutional:      Appearance: Normal appearance.   HENT:     Head: Normocephalic.     Right Ear: Tympanic membrane, ear canal and external ear normal.     Left Ear: Tympanic membrane, ear canal and external ear normal.     Nose: Congestion present.     Right Sinus: Maxillary sinus tenderness present.     Left Sinus: Maxillary sinus tenderness present.     Mouth/Throat:     Pharynx: No oropharyngeal exudate or posterior oropharyngeal erythema.  Eyes:     Extraocular Movements: Extraocular movements intact.  Cardiovascular:     Rate and Rhythm: Normal rate and regular rhythm.     Pulses: Normal pulses.     Heart sounds: Normal heart sounds.  Pulmonary:     Effort: Pulmonary effort is normal.     Breath sounds: Normal breath sounds.  Lymphadenopathy:     Cervical: Cervical adenopathy present.  Neurological:     Mental Status: He is alert.      UC Treatments / Results  Labs (all labs ordered are listed, but only abnormal results are displayed) Labs Reviewed - No data to display  EKG   Radiology No results found.  Procedures Procedures (including critical care time)  Medications Ordered in UC Medications - No data to display  Initial Impression / Assessment and Plan / UC Course  I have reviewed the triage vital signs and the nursing notes.  Pertinent labs & imaging results that were available during my care of the patient were reviewed by me and considered in my medical decision making (see chart for details).  Acute nonrecurrent maxillary sinusitis  Patient is in no signs of distress nor toxic appearing.  Vital signs are stable.  Low suspicion for pneumonia, pneumothorax or bronchitis and therefore will defer imaging.  Viral testing deferred, symptoms present for 1 month.  Presentation is consistent with a sinusitis, discussed most likely allergy related.  Prescribed Augmentin  and prednisone  recommended continue daily antihistamine.May use additional over-the-counter medications as needed for supportive care.  May  follow-up with urgent care as needed if symptoms persist or worsen.  Given walker referral to the allergist.  Final Clinical Impressions(s) / UC Diagnoses   Final diagnoses:  Acute non-recurrent maxillary sinusitis     Discharge Instructions      Today  you are being treated for sinus infection  Take Augmentin  every morning and every evening for 10 days to clear bacteria which may be contributing to symptoms  Begin prednisone  every morning with as directed to help with the sinus pressure, may take Tylenol in addition as needed   For cough: honey 1/2 to 1 teaspoon (you can dilute the honey in water or another fluid).  You can also use guaifenesin and dextromethorphan for cough. You can use a humidifier for chest congestion and cough.  If you don't have a humidifier, you can sit in the bathroom with the hot shower running.      For sore throat: try warm salt water gargles, cepacol lozenges, throat spray, warm tea or water with lemon/honey, popsicles or ice, or OTC cold relief medicine for throat discomfort.   For congestion: take a daily anti-histamine like Zyrtec, Claritin, and a oral decongestant, such as pseudoephedrine.  You can also use Flonase 1-2 sprays in each nostril daily.   It is important to stay hydrated: drink plenty of fluids (water, gatorade/powerade/pedialyte, juices, or teas) to keep your throat moisturized and help further relieve irritation/discomfort.'   ED Prescriptions     Medication Sig Dispense Auth. Provider   amoxicillin -clavulanate (AUGMENTIN ) 875-125 MG tablet Take 1 tablet by mouth every 12 (twelve) hours for 10 days. 20 tablet Minnah Llamas R, NP   predniSONE  (STERAPRED UNI-PAK 21 TAB) 10 MG (21) TBPK tablet Take by mouth daily. Take 6 tabs by mouth daily  for 1 days, then 5 tabs for 1 days, then 4 tabs for 1 days, then 3 tabs for 1 days, 2 tabs for 1 days, then 1 tab by mouth daily for 1 days 21 tablet Honestee Revard, Maybelle Spatz, NP      PDMP not reviewed this  encounter.   Reena Canning, NP 10/04/23 1123

## 2023-10-04 NOTE — Discharge Instructions (Addendum)
 Today you are being treated for sinus infection  Take Augmentin  every morning and every evening for 10 days to clear bacteria which may be contributing to symptoms  Begin prednisone  every morning with as directed to help with the sinus pressure, may take Tylenol in addition as needed   For cough: honey 1/2 to 1 teaspoon (you can dilute the honey in water or another fluid).  You can also use guaifenesin and dextromethorphan for cough. You can use a humidifier for chest congestion and cough.  If you don't have a humidifier, you can sit in the bathroom with the hot shower running.      For sore throat: try warm salt water gargles, cepacol lozenges, throat spray, warm tea or water with lemon/honey, popsicles or ice, or OTC cold relief medicine for throat discomfort.   For congestion: take a daily anti-histamine like Zyrtec, Claritin, and a oral decongestant, such as pseudoephedrine.  You can also use Flonase 1-2 sprays in each nostril daily.   It is important to stay hydrated: drink plenty of fluids (water, gatorade/powerade/pedialyte, juices, or teas) to keep your throat moisturized and help further relieve irritation/discomfort.'

## 2024-07-14 ENCOUNTER — Inpatient Hospital Stay: Admission: RE | Admit: 2024-07-14 | Discharge: 2024-07-14

## 2024-07-14 VITALS — BP 120/81 | HR 99 | Temp 98.2°F | Resp 18

## 2024-07-14 DIAGNOSIS — J039 Acute tonsillitis, unspecified: Secondary | ICD-10-CM

## 2024-07-14 LAB — POCT RAPID STREP A (OFFICE): Rapid Strep A Screen: NEGATIVE

## 2024-07-14 MED ORDER — AMOXICILLIN 500 MG PO CAPS: 500.0000 mg | ORAL_CAPSULE | Freq: Two times a day (BID) | ORAL | 0 refills | Status: AC

## 2024-07-14 NOTE — ED Triage Notes (Signed)
 Cough, fever, congestion, shortness of breath, stuffy nose, fatigue. - Entered by patient  Pt reports symptoms since Saturday (5 days). Subjective fever- states hot at night and sweating. Taking dayquil (last dose last night) and sudafed with minimal relief. States difficult to swallow the last few days.   Pt's daughter was positive for flu B and strep last week.

## 2024-07-14 NOTE — ED Provider Notes (Signed)
 " [facility]  Note:  This document was prepared using Dragon voice recognition software and may include unintentional dictation errors.  MRN: [id number] DOB: 19-May-1991  Subjective:   Jeffery Martin is a 34 y.o. male presenting for cough, fever, nasal congestion, chest congestion, fatigue, sore throat with difficulty swallowing x 5 days.  Patient reports that difficulty swallowing has increased over the last 2 to 3 days.  Patient was exposed to his daughter who was positive for influenza B and strep last week.  Patient has not been taking any over-the-counter medication to treat symptoms.  No chest pain, weakness, dizziness, nausea/vomiting, abdominal pain.  Current Medications[1]   Allergies[2]  Past Medical History:  Diagnosis Date   Allergy    History of chicken pox      Past Surgical History:  Procedure Laterality Date   TYMPANOSTOMY TUBE PLACEMENT  1996/97   bilateral   VASECTOMY      Family History  Problem Relation Age of Onset   Hypertension Father    Heart failure Father    Stroke Other    Cancer Paternal Grandmother        bone cancer   Heart disease Neg Hx     Social History[3]  ROS Refer to HPI for ROS details.  Objective:   Vitals: BP 120/81 (BP Location: Left Arm)   Pulse 99   Temp 98.2 F (36.8 C) (Oral)   Resp 18   SpO2 98%   Physical Exam Vitals and nursing note reviewed.  Constitutional:      General: He is not in acute distress.    Appearance: Normal appearance. He is well-developed. He is not ill-appearing or toxic-appearing.  HENT:     Head: Normocephalic.     Nose: Congestion and rhinorrhea present.     Mouth/Throat:     Mouth: Mucous membranes are moist.     Pharynx: Oropharynx is clear. Posterior oropharyngeal erythema present. No oropharyngeal exudate.  Cardiovascular:     Rate and Rhythm: Normal rate.  Pulmonary:     Effort: Pulmonary effort is normal. No respiratory distress.     Breath sounds: No stridor. No  wheezing.  Chest:     Chest wall: No tenderness.  Skin:    General: Skin is warm and dry.  Neurological:     General: No focal deficit present.     Mental Status: He is alert and oriented to person, place, and time.  Psychiatric:        Mood and Affect: Mood normal.        Behavior: Behavior normal.     Procedures  Results for orders placed or performed during the hospital encounter of 07/14/24 (from the past 24 hours)  POCT rapid strep A     Status: Normal   Collection Time: 07/14/24 10:07 AM  Result Value Ref Range   Rapid Strep A Screen Negative Negative    No results found.   Assessment and Plan :     Discharge Instructions       1. Acute tonsillitis, unspecified etiology (Primary) - POCT rapid strep A completed today in UC is negative for strep pharyngitis - Culture, group A strep collected in UC and sent to lab for further testing results should be available in 2 to 3 days.  Empiric treatment initiated with amoxicillin  due to recent exposure to strep. - amoxicillin  (AMOXIL ) 500 MG capsule; Take 1 capsule (500 mg total) by mouth 2 (two) times daily for 10 days.  Dispense:  20 capsule; Refill: 0 - Take ibuprofen  800 mg every 8 hours as needed for throat pain and inflammation.  -Continue to monitor symptoms for any change in severity if there is any escalation of current symptoms or development of new symptoms follow-up in ER for further evaluation and management.       Daxtin Leiker B Lavere Shinsky    [1] No current facility-administered medications for this encounter.  Current Outpatient Medications:    amoxicillin  (AMOXIL ) 500 MG capsule, Take 1 capsule (500 mg total) by mouth 2 (two) times daily for 10 days., Disp: 20 capsule, Rfl: 0   ibuprofen  (ADVIL ) 800 MG tablet, Take 1 tablet (800 mg total) by mouth every 8 (eight) hours as needed. (Patient not taking: Reported on 07/14/2024), Disp: 30 tablet, Rfl: 0   predniSONE  (STERAPRED UNI-PAK 21 TAB) 10 MG (21) TBPK tablet,  Take by mouth daily. Take 6 tabs by mouth daily  for 1 days, then 5 tabs for 1 days, then 4 tabs for 1 days, then 3 tabs for 1 days, 2 tabs for 1 days, then 1 tab by mouth daily for 1 days (Patient not taking: Reported on 07/14/2024), Disp: 21 tablet, Rfl: 0   promethazine -dextromethorphan (PROMETHAZINE -DM) 6.25-15 MG/5ML syrup, Take 5 mLs by mouth every 6 (six) hours as needed for cough. (Patient not taking: Reported on 07/14/2024), Disp: 118 mL, Rfl: 0   venlafaxine XR (EFFEXOR-XR) 37.5 MG 24 hr capsule, Take by mouth. (Patient not taking: Reported on 07/14/2024), Disp: , Rfl:  [2]  Allergies Allergen Reactions   Pollen Extract Other (See Comments)    Sneezing and throat swelling  [3]  Social History Tobacco Use   Smoking status: Never   Smokeless tobacco: Never  Vaping Use   Vaping status: Never Used  Substance Use Topics   Alcohol use: No   Drug use: Not Currently    Types: Marijuana     Aurea Goodell B, NP 07/14/24 1031  "

## 2024-07-14 NOTE — Discharge Instructions (Addendum)
" °  1. Acute tonsillitis, unspecified etiology (Primary) - POCT rapid strep A completed today in UC is negative for strep pharyngitis - Culture, group A strep collected in UC and sent to lab for further testing results should be available in 2 to 3 days.  Empiric treatment initiated with amoxicillin  due to recent exposure to strep. - amoxicillin  (AMOXIL ) 500 MG capsule; Take 1 capsule (500 mg total) by mouth 2 (two) times daily for 10 days.  Dispense: 20 capsule; Refill: 0 - Take ibuprofen  800 mg every 8 hours as needed for throat pain and inflammation.  -Continue to monitor symptoms for any change in severity if there is any escalation of current symptoms or development of new symptoms follow-up in ER for further evaluation and management. "

## 2024-07-16 ENCOUNTER — Ambulatory Visit (HOSPITAL_COMMUNITY): Payer: Self-pay

## 2024-07-16 LAB — CULTURE, GROUP A STREP (THRC)
